# Patient Record
Sex: Female | Born: 2000 | Hispanic: Yes | Marital: Single | Smoking: Never smoker
Health system: Southern US, Community
[De-identification: ages and names within clinical notes are randomized; demographics above are authoritative.]

## PROBLEM LIST (undated history)

## (undated) DIAGNOSIS — M329 Systemic lupus erythematosus, unspecified: Secondary | ICD-10-CM

## (undated) DIAGNOSIS — E0590 Thyrotoxicosis, unspecified without thyrotoxic crisis or storm: Secondary | ICD-10-CM

## (undated) DIAGNOSIS — D6861 Antiphospholipid syndrome: Secondary | ICD-10-CM

## (undated) HISTORY — PX: CHOLECYSTECTOMY: SHX55

---
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
## 2022-07-11 IMAGING — CT CT ABD-PELV W/ CM
2 of 4 series · 16 of 46 positions shown, 18 images · IV contrast (APPLIED)
Comparison: None.

CLINICAL DATA: Abdominal pain.

EXAM:
CT ABDOMEN AND PELVIS WITH CONTRAST
TECHNIQUE: Multidetector CT imaging of the abdomen and pelvis was performed
using the standard protocol following bolus administration of
intravenous contrast.
CONTRAST:  100mL OMNIPAQUE IOHEXOL 300 MG/ML  SOLN

[Series 2: abd pel w · axial · 0.68mm/px · z∈[+641,+1056]mm · 13 of 91 slices shown, 15 images]
[im 4/91  soft-tissue]
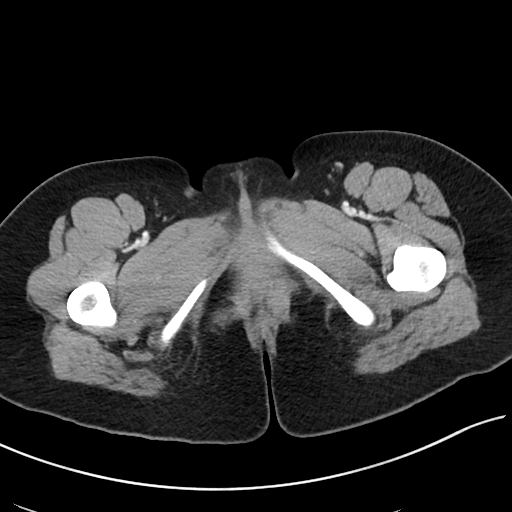
[im 4/91  bone]
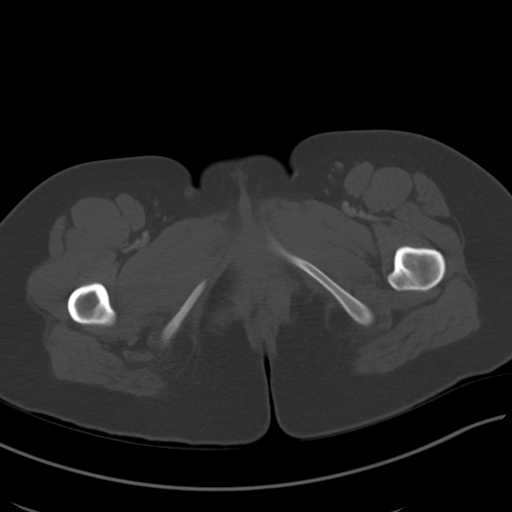
[im 11/91  soft-tissue]
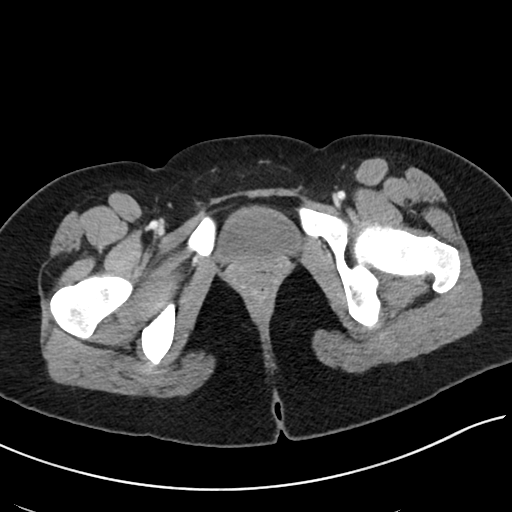
[im 19/91  soft-tissue]
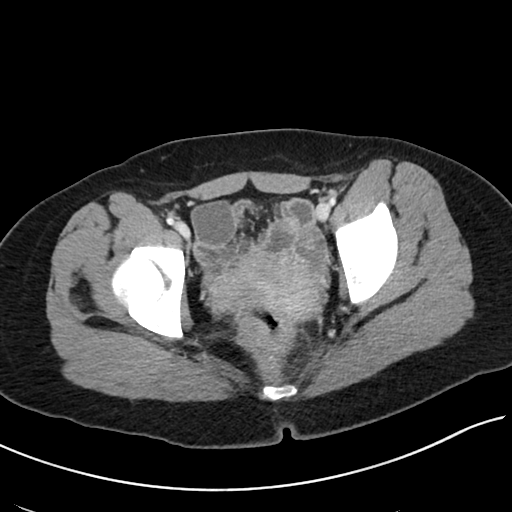
[im 26/91  soft-tissue]
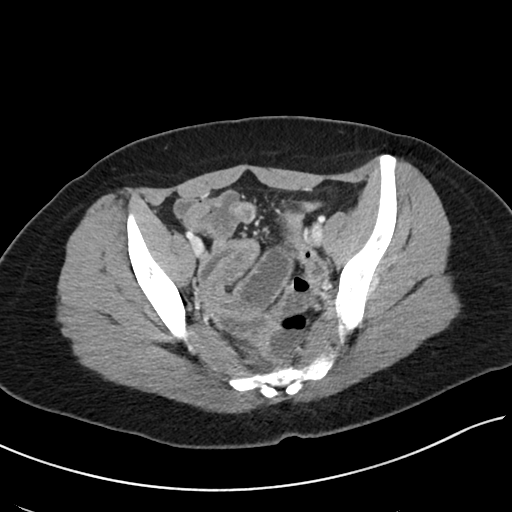
[im 33/91  soft-tissue]
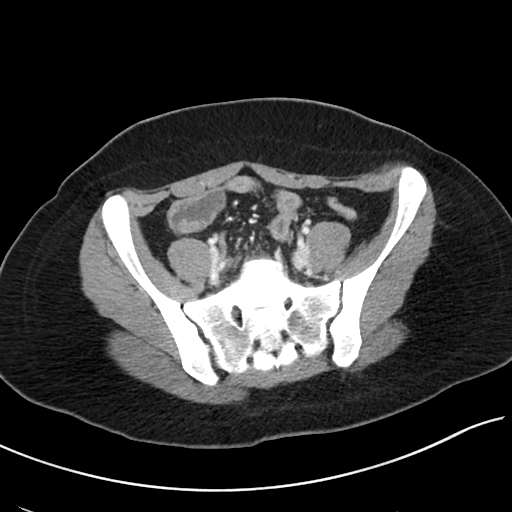
[im 40/91  soft-tissue]
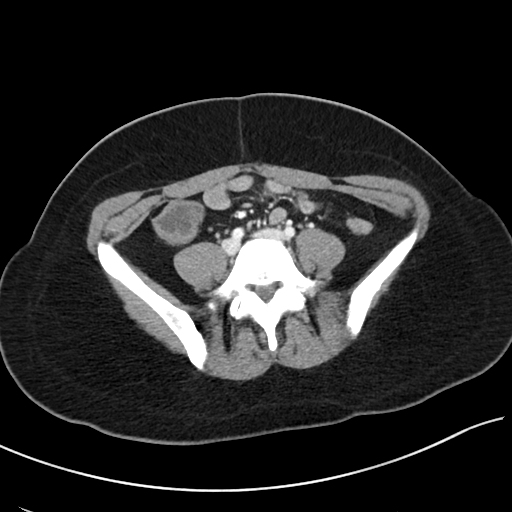
[im 47/91  soft-tissue]
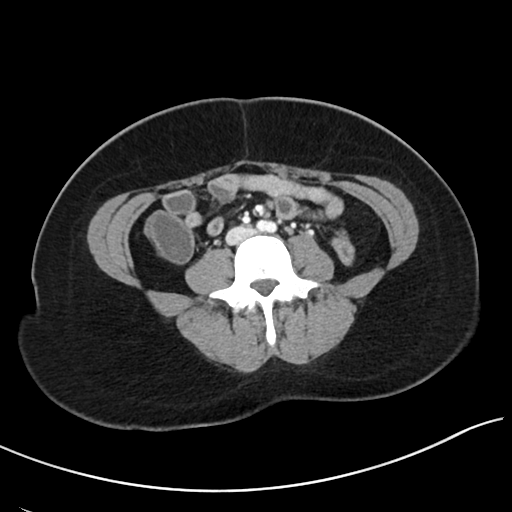
[im 51/91  soft-tissue]
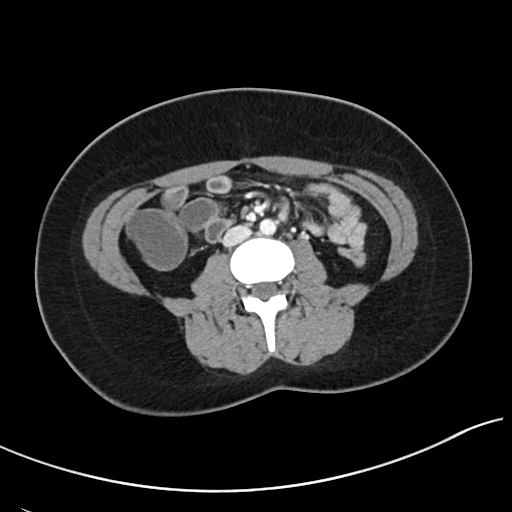
[im 58/91  soft-tissue]
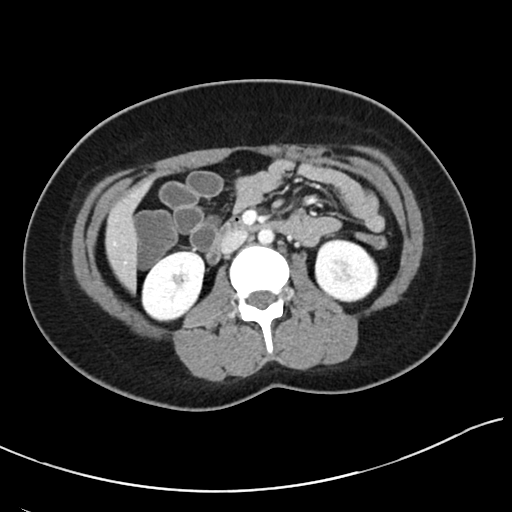
[im 58/91  bone]
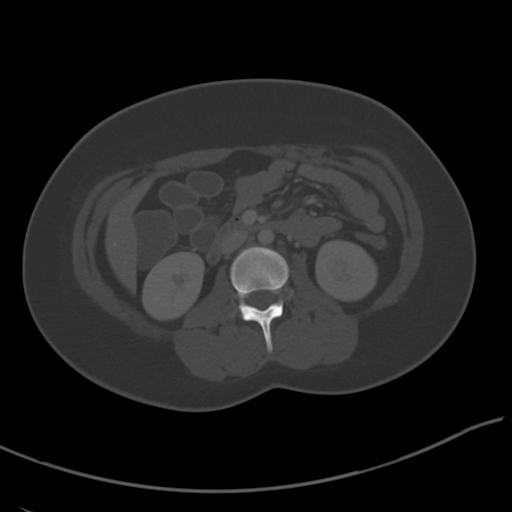
[im 65/91  soft-tissue]
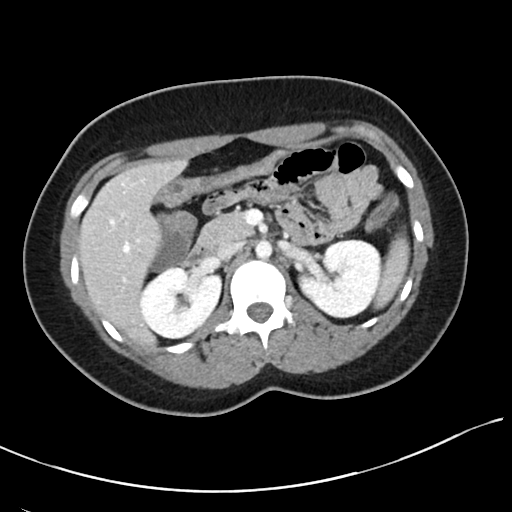
[im 73/91  soft-tissue]
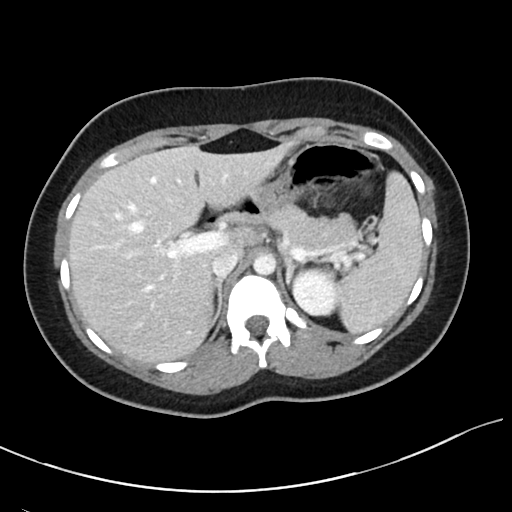
[im 80/91  soft-tissue]
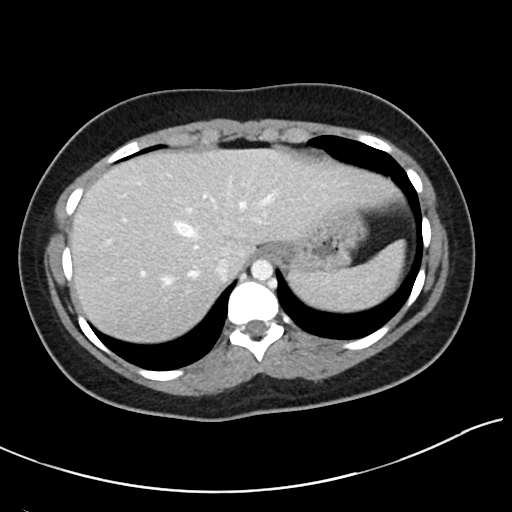
[im 87/91  soft-tissue]
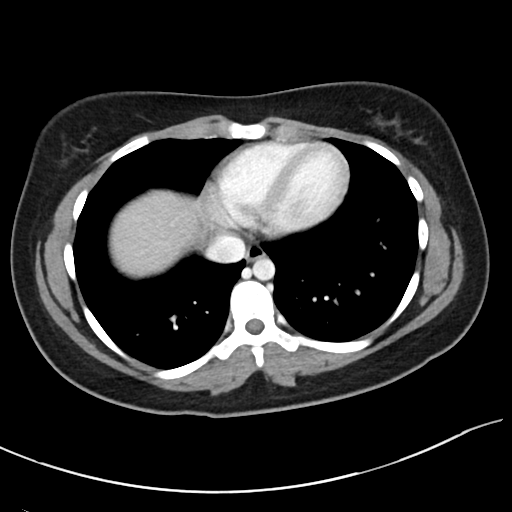

[Series 5: coronal · coronal · 0.63mm/px · 3 of 87 slices shown]
[im 29/87  soft-tissue]
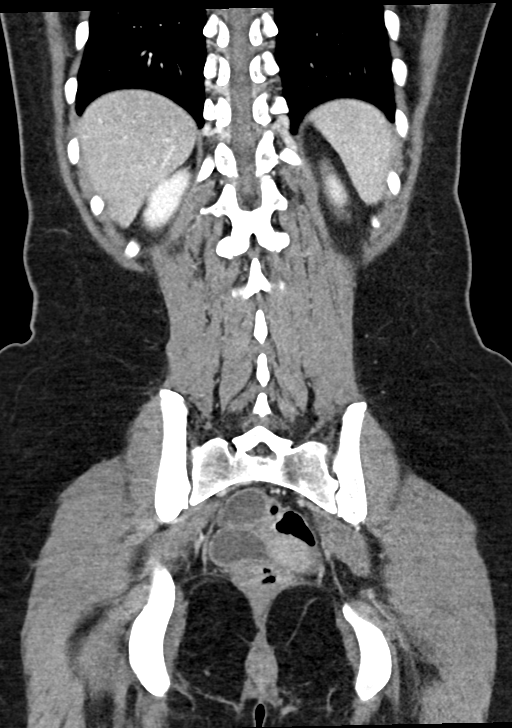
[im 39/87  soft-tissue]
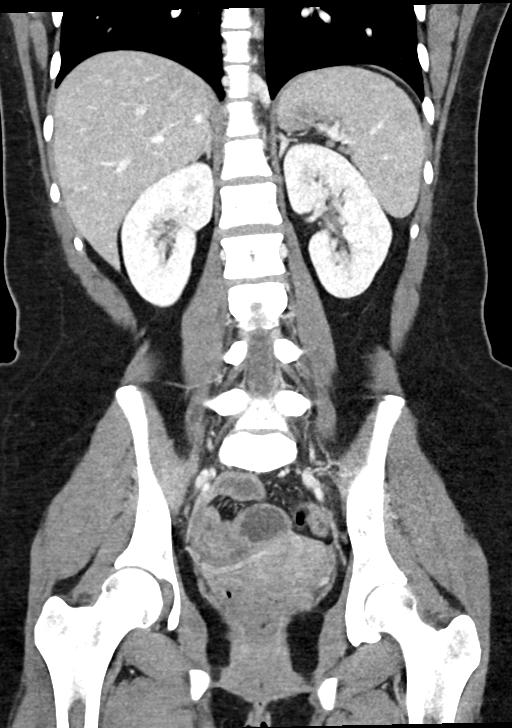
[im 48/87  soft-tissue]
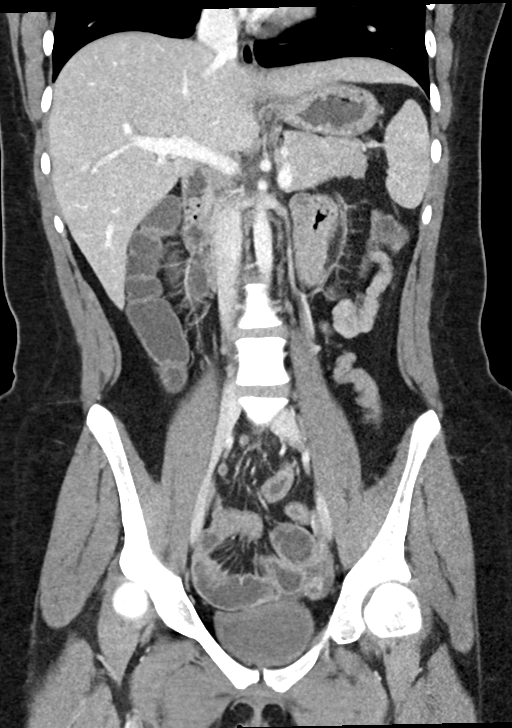

[16 of 46 positions shown; findings below may reference images not displayed]

FINDINGS: Lower chest: The visualized lung bases are clear.

No intra-abdominal free air or free fluid.

Hepatobiliary: The liver is unremarkable. No intrahepatic biliary
dilatation. Cholecystectomy.

Pancreas: Unremarkable. No pancreatic ductal dilatation or
surrounding inflammatory changes.

Spleen: Normal in size without focal abnormality.

Adrenals/Urinary Tract: The adrenal glands unremarkable. The
kidneys, visualized ureters, and urinary bladder appear
unremarkable.

Stomach/Bowel: Multiple fluid-filled loops of small bowel with
thickened walls most consistent with enteritis. There is loose stool
throughout the colon compatible with diarrheal state. There is no
bowel obstruction. The appendix is normal.

Vascular/Lymphatic: The abdominal aorta and IVC unremarkable. No
portal venous gas. There is no adenopathy.

Reproductive: The uterus is retroverted.  No adnexal masses.

Other: None

Musculoskeletal: No acute or significant osseous findings.
IMPRESSION: Enteritis with diarrheal state. No bowel obstruction. Normal
appendix.

## 2022-08-07 ENCOUNTER — Emergency Department (HOSPITAL_BASED_OUTPATIENT_CLINIC_OR_DEPARTMENT_OTHER): Payer: Medicaid Other

## 2022-08-07 ENCOUNTER — Encounter (HOSPITAL_BASED_OUTPATIENT_CLINIC_OR_DEPARTMENT_OTHER): Payer: Self-pay

## 2022-08-07 ENCOUNTER — Other Ambulatory Visit: Payer: Self-pay

## 2022-08-07 ENCOUNTER — Emergency Department (HOSPITAL_BASED_OUTPATIENT_CLINIC_OR_DEPARTMENT_OTHER)
Admission: EM | Admit: 2022-08-07 | Discharge: 2022-08-07 | Disposition: A | Discharge status: Home / Self Care | Payer: Medicaid Other | Attending: Emergency Medicine | Admitting: Emergency Medicine

## 2022-08-07 DIAGNOSIS — M79602 Pain in left arm: Secondary | ICD-10-CM | POA: Diagnosis not present

## 2022-08-07 DIAGNOSIS — Z79899 Other long term (current) drug therapy: Secondary | ICD-10-CM | POA: Diagnosis not present

## 2022-08-07 DIAGNOSIS — R2232 Localized swelling, mass and lump, left upper limb: Secondary | ICD-10-CM | POA: Diagnosis not present

## 2022-08-07 DIAGNOSIS — E039 Hypothyroidism, unspecified: Secondary | ICD-10-CM | POA: Diagnosis not present

## 2022-08-07 LAB — CBC
HCT: 42 % (ref 36.0–46.0)
Hemoglobin: 14.6 g/dL (ref 12.0–15.0)
MCH: 29.9 pg (ref 26.0–34.0)
MCHC: 34.8 g/dL (ref 30.0–36.0)
MCV: 85.9 fL (ref 80.0–100.0)
Platelets: 281 10*3/uL (ref 150–400)
RBC: 4.89 MIL/uL (ref 3.87–5.11)
RDW: 12.6 % (ref 11.5–15.5)
WBC: 6.6 10*3/uL (ref 4.0–10.5)
nRBC: 0 % (ref 0.0–0.2)

## 2022-08-07 LAB — BASIC METABOLIC PANEL
Anion gap: 8 (ref 5–15)
BUN: 8 mg/dL (ref 6–20)
CO2: 27 mmol/L (ref 22–32)
Calcium: 10.2 mg/dL (ref 8.9–10.3)
Chloride: 103 mmol/L (ref 98–111)
Creatinine, Ser: 0.59 mg/dL (ref 0.44–1.00)
GFR, Estimated: >60 mL/min (ref >60)
Glucose, Bld: 89 mg/dL (ref 70–99)
Potassium: 3.9 mmol/L (ref 3.5–5.1)
Sodium: 138 mmol/L (ref 135–145)

## 2022-08-07 NOTE — ED Provider Notes (Signed)
[facility] Provider Note   CSN: [id number] Arrival date & time: 08/07/22  1228     History  Chief Complaint  Patient presents with   Arm Swelling    Tonya Thomas is a 22 y.o. female.  HPI   22 year old female presents emergency department with complaints of lump on left upper extremity with swelling.  Patient states that she noticed area over the past 1 to 2 days.  Describes just noticing lump at the beginning with no pain.  Now she said it is mildly discomfort.  History of antiphospholipid syndrome and currently not anticoagulated.  Reports some subjective swelling of left upper extremity.  Denies any weakness or sensory deficits or known traumatic injury.  Pain is worsened with palpation but not movement of her upper extremity.  Denies chest pain, shortness of breath, fever, cough, congestion.  Past medical history significant for antiphospholipid syndrome, hypothyroidism  Home Medications Prior to Admission medications   Medication Sig Start Date End Date Taking? Authorizing Provider  levothyroxine (SYNTHROID) 25 MCG tablet Take 25 mcg by mouth daily. 07/18/21   [provider]  ondansetron (ZOFRAN) 4 MG tablet Take 1 tablet (4 mg total) by mouth every 8 (eight) hours as needed for nausea or vomiting. 06/18/21   Tegeler, Gwenyth Allegra, MD      Allergies    Patient has no known allergies.    Review of Systems   Review of Systems  All other systems reviewed and are negative.   Physical Exam Updated Vital Signs BP 122/77 (BP Location: Right Arm)   Pulse 74   Temp 97.8 F (36.6 C)   Resp 16   Ht 5' 1"$  (1.549 m)   Wt 64.4 kg   SpO2 100%   BMI 26.83 kg/m  Physical Exam Vitals and nursing note reviewed.  Constitutional:      General: She is not in acute distress.    Appearance: She is well-developed.  HENT:     Head: Normocephalic and atraumatic.  Eyes:     Conjunctiva/sclera: Conjunctivae normal.   Cardiovascular:     Rate and Rhythm: Normal rate and regular rhythm.     Heart sounds: No murmur heard. Pulmonary:     Effort: Pulmonary effort is normal. No respiratory distress.     Breath sounds: Normal breath sounds.  Abdominal:     Palpations: Abdomen is soft.     Tenderness: There is no abdominal tenderness.  Musculoskeletal:        General: No swelling.     Cervical back: Neck supple.     Comments: Very faint possible small palpable mass on left posterior upper arm and area of tricep; area possibly more pronounced than right side.  No obvious induration, erythema, palpable fluctuance.  Possible swelling but difficult to assess secondary to body habitus.  Radial pulses 2+ bilaterally.  Full AROM of bilateral shoulders, elbows, wrists, digits.  Muscle strength 5 out of 5 bilaterally.  Skin:    General: Skin is warm and dry.     Capillary Refill: Capillary refill takes less than 2 seconds.  Neurological:     Mental Status: She is alert.  Psychiatric:        Mood and Affect: Mood normal.     ED Results / Procedures / Treatments   Labs (all labs ordered are listed, but only abnormal results are displayed) Labs Reviewed  BASIC METABOLIC PANEL  CBC    EKG None  Radiology [place] LT UPPER  EXTREM LTD SOFT TISSUE NON VASCULAR  Result Date: 08/07/2022 CLINICAL DATA:  Swelling along the dorsal triceps. EXAM: ULTRASOUND LEFT UPPER EXTREMITY LIMITED TECHNIQUE: Ultrasound examination of the upper extremity soft tissues was performed in the area of clinical concern of the dorsal aspect of the upper from deltoid to the elbow. COMPARISON:  None Available. FINDINGS: Normal triceps muscle and tendon vessels are patent. No significant soft tissue edema. IMPRESSION: Normal ultrasound of the left triceps. Electronically Signed   By: Keane Police D.O.   On: 08/07/2022 15:52    Procedures Procedures    Medications Ordered in ED Medications - No data to display  ED Course/ Medical Decision  Making/ A&P                             Medical Decision Making Amount and/or Complexity of Data Reviewed Labs: ordered. Radiology: ordered.   This patient presents to the ED for concern of left arm pain, this involves an extensive number of treatment options, and is a complaint that carries with it a high risk of complications and morbidity.  The differential diagnosis includes fracture, strain chest pain, dislocation, mass, DVT, PAD, ischemic limb, compartment syndrome, neurovascular compromise, ligamentous/tendinous injury, muscular injury   Co morbidities that complicate the patient evaluation  See HPI   Additional history obtained:  Additional history obtained from EMR External records from outside source obtained and reviewed including hospital records   Lab Tests:  I Ordered, and personally interpreted labs.  The pertinent results include: No leukocytosis noted.  No evidence of anemia.  Platelets within range.  No electrolyte abnormalities appreciated.  No renal dysfunction.   Imaging Studies ordered:  I ordered imaging studies including left upper extremity ultrasound I independently visualized and interpreted imaging which showed no acute soft tissue mass or DVT I agree with the radiologist interpretation -Discussed case with ultrasound tech who initially performed vascular ultrasound of left upper extremity for DVT rule out when negative, switch to soft tissue of left upper extremity which was also negative.  Cardiac Monitoring: / EKG:  The patient was maintained on a cardiac monitor.  I personally viewed and interpreted the cardiac monitored which showed an underlying rhythm of: Sinus rhythm   Consultations Obtained:  N/a   Problem List / ED Course / Critical interventions / Medication management  Left upper extremity pain Reevaluation of the patient showed that the patient stayed the same I have reviewed the patients home medicines and have made  adjustments as needed   Social Determinants of Health:  Denies tobacco, licit drug use   Test / Admission - Considered:  Left upper extremity pain Vitals signs within normal range and stable throughout visit. Laboratory/imaging studies significant for: See above Patient's workup today overall reassuring.  No clinical indication for DVT of upper extremity or soft tissue mass appreciated on ultrasound.  Patient has equal pulses bilaterally.  Doubt DVT, PAD.  Could be secondary to muscular injury given area of tenderness and reproducibility with resisted extension of left upper extremity at elbow.  Patient recommended rest, ice, elevation of affected extremity as well as NSAIDs in the form of ibuprofen/Aleve.  Follow-up with primary care recommended for reassessment of symptoms.  Treatment plan discussed at length with patient and she acknowledged understanding was agreeable to said plan. Worrisome signs and symptoms were discussed with the patient, and the patient acknowledged understanding to return to the ED if noticed. Patient was stable upon  discharge.          Final Clinical Impression(s) / ED Diagnoses Final diagnoses:  Left arm pain    Rx / DC Orders ED Discharge Orders     None         Wilnette Kales, [place] 08/07/22 1601    Leanord Asal K, DO 08/07/22 1606

## 2022-08-07 NOTE — ED Triage Notes (Addendum)
Patient here POV from Home.  Endorses Area of localized swelling to left upper arm that was noted Last PM. Causes Discomfort to area and surrounding tissue. No Warmth Noted.   No Known Fevers. No Trauma.   NAD Noted during Triage. A&Ox4. GCS 15. Ambulatory

## 2022-08-07 NOTE — Discharge Instructions (Signed)
Note the workup today is overall reassuring.  No evidence of soft tissue mass or blood clot in your left upper extremity.  Notes likely secondary to muscular injury.  Treat with rest, ice, elevation and NSAIDs in the form of ibuprofen/Aleve.  Recommend follow-up with primary care for reassessment of symptoms.  Please do not hesitate to return to emergency department for worrisome signs and symptoms we discussed become apparent.

## 2022-11-24 ENCOUNTER — Emergency Department (HOSPITAL_BASED_OUTPATIENT_CLINIC_OR_DEPARTMENT_OTHER)
Admission: EM | Admit: 2022-11-24 | Discharge: 2022-11-24 | Disposition: A | Discharge status: Home / Self Care | Payer: Medicaid Other | Attending: Emergency Medicine | Admitting: Emergency Medicine

## 2022-11-24 ENCOUNTER — Emergency Department (HOSPITAL_BASED_OUTPATIENT_CLINIC_OR_DEPARTMENT_OTHER): Payer: Medicaid Other

## 2022-11-24 ENCOUNTER — Encounter (HOSPITAL_BASED_OUTPATIENT_CLINIC_OR_DEPARTMENT_OTHER): Payer: Self-pay

## 2022-11-24 ENCOUNTER — Other Ambulatory Visit: Payer: Self-pay

## 2022-11-24 DIAGNOSIS — X58XXXA Exposure to other specified factors, initial encounter: Secondary | ICD-10-CM | POA: Diagnosis not present

## 2022-11-24 DIAGNOSIS — S29012A Strain of muscle and tendon of back wall of thorax, initial encounter: Secondary | ICD-10-CM | POA: Insufficient documentation

## 2022-11-24 DIAGNOSIS — S39012A Strain of muscle, fascia and tendon of lower back, initial encounter: Secondary | ICD-10-CM

## 2022-11-24 DIAGNOSIS — M549 Dorsalgia, unspecified: Secondary | ICD-10-CM | POA: Diagnosis present

## 2022-11-24 HISTORY — DX: Systemic lupus erythematosus, unspecified: M32.9

## 2022-11-24 MED ORDER — KETOROLAC TROMETHAMINE 15 MG/ML IJ SOLN
15.0000 mg | Freq: Once | INTRAMUSCULAR | Status: AC
  Administered 2022-11-24: 15 mg via INTRAMUSCULAR
  Filled 2022-11-24: qty 1

## 2022-11-24 NOTE — ED Provider Notes (Signed)
[place] EMERGENCY DEPARTMENT AT [facility] Provider Note   CSN: [id number] Arrival date & time: 11/24/22  1835     History  Chief Complaint  Patient presents with   Chest Pain    Tonya Thomas is a 22 y.o. female.  22 yo F with a cc of right sided back and chest pain.  Going on since this morning, though maybe she slept on in wrong.  Hasn't gotten better so went to urgent care for eval.  Patient has hx of antiphospholipid syndrome and due to this sent her here for evaluation.   She denies hx of PE or DVT.  Denies hemoptysis unilateral lower extremity edema recent surgery immobilization or hospitalization.   Chest Pain      Home Medications Prior to Admission medications   Medication Sig Start Date End Date Taking? Authorizing Provider  levothyroxine (SYNTHROID) 25 MCG tablet Take 25 mcg by mouth daily. 07/18/21   [provider]  ondansetron (ZOFRAN) 4 MG tablet Take 1 tablet (4 mg total) by mouth every 8 (eight) hours as needed for nausea or vomiting. 06/18/21   Tegeler, Canary Brim, MD      Allergies    Patient has no known allergies.    Review of Systems   Review of Systems  Cardiovascular:  Positive for chest pain.    Physical Exam Updated Vital Signs BP 122/75 (BP Location: Right Arm)   Pulse 74   Temp 98.2 F (36.8 C) (Oral)   Resp 18   Ht 5\' 1"  (1.549 m)   Wt 65.8 kg   SpO2 98%   BMI 27.40 kg/m  Physical Exam Vitals and nursing note reviewed.  Constitutional:      General: She is not in acute distress.    Appearance: She is well-developed. She is not diaphoretic.  HENT:     Head: Normocephalic and atraumatic.  Eyes:     Pupils: Pupils are equal, round, and reactive to light.  Cardiovascular:     Rate and Rhythm: Normal rate and regular rhythm.     Heart sounds: No murmur heard.    No friction rub. No gallop.  Pulmonary:     Effort: Pulmonary effort is normal.     Breath sounds: No wheezing or rales.  Abdominal:      General: There is no distension.     Palpations: Abdomen is soft.     Tenderness: There is no abdominal tenderness.  Musculoskeletal:        General: No tenderness.     Cervical back: Normal range of motion and neck supple.     Comments: Patient has pain in between the scapula and T-spine reproduces her discomfort.  Skin:    General: Skin is warm and dry.  Neurological:     Mental Status: She is alert and oriented to person, place, and time.  Psychiatric:        Behavior: Behavior normal.     ED Results / Procedures / Treatments   Labs (all labs ordered are listed, but only abnormal results are displayed) Labs Reviewed - No data to display  EKG EKG Interpretation  Date/Time:  Sunday November 24 2022 18:43:41 EDT Ventricular Rate:  71 PR Interval:  141 QRS Duration: 88 QT Interval:  390 QTC Calculation: 424 R Axis:   80 Text Interpretation: Sinus rhythm No old tracing to compare Confirmed by Melene Plan [phone]) on 11/24/2022 6:45:27 PM  Radiology DG Chest Port 1 View  Result Date: 11/24/2022 CLINICAL DATA:  Chest pain. EXAM: PORTABLE CHEST 1 VIEW COMPARISON:  Chest CTA 12/25/2020 FINDINGS: Low lung volumes.The cardiomediastinal contours are normal. The lungs are clear. Pulmonary vasculature is normal. No consolidation, pleural effusion, or pneumothorax. No acute osseous abnormalities are seen. IMPRESSION: Hypoventilatory chest without acute findings. Electronically Signed   By: Narda Rutherford M.D.   On: 11/24/2022 19:44    Procedures Procedures    Medications Ordered in ED Medications  ketorolac (TORADOL) 15 MG/ML injection 15 mg (15 mg Intramuscular Given 11/24/22 1903)    ED Course/ Medical Decision Making/ A&P                             Medical Decision Making Amount and/or Complexity of Data Reviewed Radiology: ordered.  Risk Prescription drug management.   22 yo F with a chief complaints of chest pain that radiates to the back.  This been going on since this  morning.  It is worse with twisting bending palpation.  Arm movement.  She went to urgent care and they were concerned about her history of antiphospholipid syndrome and sent her here for evaluation.  On record review the patient has an interesting history, she had a early delivery and had a workup done for that which showed antiphospholipid syndrome she is never had any miscarriages or clots.  Since then she has had more blood test to try and evaluate the cause of that.  She recently had a positive ANA and was referred to rheumatology.  I am not sure if it has any significance in this case.  Most likely is musculoskeletal by history and physical.  EKG is nonischemic.  Chest x-ray to evaluate for pneumothorax or focal infiltrate.  Chest x-ray independently interpreted by me without focal infiltrate or pneumothorax.  Will discharge the patient home.  PCP follow-up.  7:50 PM:  I have discussed the diagnosis/risks/treatment options with the patient.  Evaluation and diagnostic testing in the emergency department does not suggest an emergent condition requiring admission or immediate intervention beyond what has been performed at this time.  They will follow up with PCP. We also discussed returning to the ED immediately if new or worsening sx occur. We discussed the sx which are most concerning (e.g., sudden worsening pain, fever, inability to tolerate by mouth) that necessitate immediate return. Medications administered to the patient during their visit and any new prescriptions provided to the patient are listed below.  Medications given during this visit Medications  ketorolac (TORADOL) 15 MG/ML injection 15 mg (15 mg Intramuscular Given 11/24/22 1903)     The patient appears reasonably screen and/or stabilized for discharge and I doubt any other medical condition or other [facility] requiring further screening, evaluation, or treatment in the ED at this time prior to discharge.          Final Clinical  Impression(s) / ED Diagnoses Final diagnoses:  Back strain, initial encounter    Rx / DC Orders ED Discharge Orders     None         Melene Plan, DO 11/24/22 1950

## 2022-11-24 NOTE — ED Triage Notes (Signed)
Patient here POV from Home.  Endorses Right Sided CP for a few weeks intermittently along with Right Sided Upper Back Pain. Constant since this AM.   Some SOB. Some Nausea. No Emesis or Fevers.   NAD Noted during Triage. A&Ox4. Gcs 15. Ambulatory.

## 2022-11-24 NOTE — ED Notes (Signed)
Port X-ray at bedside.  

## 2022-11-24 NOTE — ED Notes (Signed)
Reviewed AVS with patient, patient expressed understanding of directions, denies further questions at this time. 

## 2022-11-24 NOTE — Discharge Instructions (Signed)
Your chest x-ray and EKG were normal.  Please return for worsening symptoms if you start developing worsening difficulty breathing if you are coughing up blood or if you pass out.  Please follow-up with your family doctor in the office.  Take 4 over the counter ibuprofen tablets 3 times a day or 2 over-the-counter naproxen tablets twice a day for pain. Also take tylenol 1000mg (2 extra strength) four times a day.

## 2023-05-01 ENCOUNTER — Emergency Department (HOSPITAL_BASED_OUTPATIENT_CLINIC_OR_DEPARTMENT_OTHER): Payer: Medicaid Other

## 2023-05-01 ENCOUNTER — Emergency Department (HOSPITAL_BASED_OUTPATIENT_CLINIC_OR_DEPARTMENT_OTHER)
Admission: EM | Admit: 2023-05-01 | Discharge: 2023-05-01 | Disposition: A | Discharge status: Home / Self Care | Payer: Medicaid Other | Attending: Emergency Medicine | Admitting: Emergency Medicine

## 2023-05-01 ENCOUNTER — Other Ambulatory Visit: Payer: Self-pay

## 2023-05-01 ENCOUNTER — Encounter (HOSPITAL_BASED_OUTPATIENT_CLINIC_OR_DEPARTMENT_OTHER): Payer: Self-pay | Admitting: Emergency Medicine

## 2023-05-01 DIAGNOSIS — R791 Abnormal coagulation profile: Secondary | ICD-10-CM | POA: Insufficient documentation

## 2023-05-01 DIAGNOSIS — Z859 Personal history of malignant neoplasm, unspecified: Secondary | ICD-10-CM | POA: Insufficient documentation

## 2023-05-01 DIAGNOSIS — M3210 Systemic lupus erythematosus, organ or system involvement unspecified: Secondary | ICD-10-CM | POA: Insufficient documentation

## 2023-05-01 DIAGNOSIS — R1012 Left upper quadrant pain: Secondary | ICD-10-CM | POA: Insufficient documentation

## 2023-05-01 LAB — CBC
HCT: 39.6 % (ref 36.0–46.0)
Hemoglobin: 14 g/dL (ref 12.0–15.0)
MCH: 29.8 pg (ref 26.0–34.0)
MCHC: 35.4 g/dL (ref 30.0–36.0)
MCV: 84.3 fL (ref 80.0–100.0)
Platelets: 245 10*3/uL (ref 150–400)
RBC: 4.7 MIL/uL (ref 3.87–5.11)
RDW: 12.6 % (ref 11.5–15.5)
WBC: 5.7 10*3/uL (ref 4.0–10.5)
nRBC: 0 % (ref 0.0–0.2)

## 2023-05-01 LAB — COMPREHENSIVE METABOLIC PANEL
ALT: 33 U/L (ref 0–44)
AST: 28 U/L (ref 15–41)
Albumin: 4.4 g/dL (ref 3.5–5.0)
Alkaline Phosphatase: 75 U/L (ref 38–126)
Anion gap: 7 (ref 5–15)
BUN: 9 mg/dL (ref 6–20)
CO2: 24 mmol/L (ref 22–32)
Calcium: 9.8 mg/dL (ref 8.9–10.3)
Chloride: 105 mmol/L (ref 98–111)
Creatinine, Ser: 0.56 mg/dL (ref 0.44–1.00)
GFR, Estimated: >60 mL/min (ref >60)
Glucose, Bld: 99 mg/dL (ref 70–99)
Potassium: 3.9 mmol/L (ref 3.5–5.1)
Sodium: 136 mmol/L (ref 135–145)
Total Bilirubin: 0.5 mg/dL (ref <1.2)
Total Protein: 7.6 g/dL (ref 6.5–8.1)

## 2023-05-01 LAB — D-DIMER, QUANTITATIVE: D-Dimer, Quant: 1 ug{FEU}/mL — ABNORMAL HIGH (ref 0.00–0.50)

## 2023-05-01 LAB — URINALYSIS, ROUTINE W REFLEX MICROSCOPIC
Bilirubin Urine: NEGATIVE
Glucose, UA: NEGATIVE mg/dL
Hgb urine dipstick: NEGATIVE
Ketones, ur: NEGATIVE mg/dL
Leukocytes,Ua: NEGATIVE
Nitrite: NEGATIVE
Protein, ur: NEGATIVE mg/dL
Specific Gravity, Urine: 1.025 (ref 1.005–1.030)
pH: 7 (ref 5.0–8.0)

## 2023-05-01 LAB — PREGNANCY, URINE: Preg Test, Ur: NEGATIVE

## 2023-05-01 LAB — LIPASE, BLOOD: Lipase: 22 U/L (ref 11–51)

## 2023-05-01 MED ORDER — ALUM & MAG HYDROXIDE-SIMETH 200-200-20 MG/5ML PO SUSP
30.0000 mL | Freq: Once | ORAL | Status: AC
  Administered 2023-05-01: 30 mL via ORAL
  Filled 2023-05-01: qty 30

## 2023-05-01 MED ORDER — FAMOTIDINE IN NACL 20-0.9 MG/50ML-% IV SOLN
20.0000 mg | INTRAVENOUS | Status: AC
  Administered 2023-05-01: 20 mg via INTRAVENOUS
  Filled 2023-05-01: qty 50

## 2023-05-01 MED ORDER — IOHEXOL 350 MG/ML SOLN
100.0000 mL | Freq: Once | INTRAVENOUS | Status: AC | PRN
Start: 2023-05-01 — End: 2023-05-01
  Administered 2023-05-01: 75 mL via INTRAVENOUS

## 2023-05-01 NOTE — ED Triage Notes (Signed)
Pt c/o LUQ pain today with nausea

## 2023-05-01 NOTE — ED Notes (Signed)
Patient transported to CT 

## 2023-05-01 NOTE — ED Notes (Addendum)
RT Note:   Patient oxygen saturation on room air while at rest 97% HR 72, RR 16, Patient oxygen saturation on room air while ambulating 100%, HR 82, RR18  Patient tolerated the walk well with no distress

## 2023-05-01 NOTE — ED Provider Notes (Signed)
[place] EMERGENCY DEPARTMENT AT [facility] Provider Note   CSN: [id number] Arrival date & time: 05/01/23  1151     History  Chief Complaint  Patient presents with   Abdominal Pain    Tonya Thomas is a 22 y.o. female with PMHx antiphospholipid syndrome, hyperthyroidism, lupus who presents to ED concerned for intermittent LUQ pain and nausea x1 day. 7/10 severity. Walking makes it worse. No change in severity when eating. LMP 5 weeks ago. Denies sore throat and fatigue.  Patient most concerned for blood clot given her medical hx.  Denies fever, chest pain, dyspnea, cough, vomiting, diarrhea. Denies recent surgery/immobilization, hx DVT/PE, hemoptysis, hx cancer in the past 6 months, calf swelling/tenderness. Denies tob hx. No birth control.      Abdominal Pain      Home Medications Prior to Admission medications   Medication Sig Start Date End Date Taking? Authorizing Provider  levothyroxine (SYNTHROID) 25 MCG tablet Take 25 mcg by mouth daily. 07/18/21   [provider]  ondansetron (ZOFRAN) 4 MG tablet Take 1 tablet (4 mg total) by mouth every 8 (eight) hours as needed for nausea or vomiting. 06/18/21   Tegeler, Canary Brim, MD      Allergies    Patient has no known allergies.    Review of Systems   Review of Systems  Gastrointestinal:  Positive for abdominal pain.    Physical Exam Updated Vital Signs BP (!) 104/55   Pulse 87   Temp 99.1 F (37.3 C) (Temporal)   Resp 18   Wt 65.8 kg   LMP 03/29/2023   SpO2 99%   BMI 27.40 kg/m  Physical Exam Vitals and nursing note reviewed.  Constitutional:      General: She is not in acute distress. HENT:     Head: Normocephalic and atraumatic.     Mouth/Throat:     Mouth: Mucous membranes are moist.     Pharynx: No oropharyngeal exudate or posterior oropharyngeal erythema.  Eyes:     General: No scleral icterus.       Right eye: No discharge.        Left eye: No discharge.      Conjunctiva/sclera: Conjunctivae normal.  Cardiovascular:     Rate and Rhythm: Normal rate and regular rhythm.     Pulses: Normal pulses.     Heart sounds: Normal heart sounds. No murmur heard. Pulmonary:     Effort: Pulmonary effort is normal. No respiratory distress.     Breath sounds: Normal breath sounds. No wheezing, rhonchi or rales.  Abdominal:     General: Abdomen is flat. Bowel sounds are normal. There is no distension.     Palpations: Abdomen is soft. There is no splenomegaly or mass.     Tenderness: There is abdominal tenderness in the left upper quadrant.  Musculoskeletal:     Right lower leg: No edema.     Left lower leg: No edema.     Comments: No calf tenderness to palpation. No calf swelling or erythema.  Skin:    General: Skin is warm and dry.     Findings: No rash.  Neurological:     General: No focal deficit present.     Mental Status: She is alert. Mental status is at baseline.  Psychiatric:        Mood and Affect: Mood normal.        Behavior: Behavior normal.     ED Results / Procedures / Treatments   Labs (all  labs ordered are listed, but only abnormal results are displayed) Labs Reviewed  D-DIMER, QUANTITATIVE - Abnormal; Notable for the following components:      Result Value   D-Dimer, Quant 1.00 (*)    All other components within normal limits  LIPASE, BLOOD  COMPREHENSIVE METABOLIC PANEL  CBC  URINALYSIS, ROUTINE W REFLEX MICROSCOPIC  PREGNANCY, URINE    EKG EKG Interpretation Date/Time:  Thursday May 01 2023 15:27:12 EST Ventricular Rate:  79 PR Interval:  144 QRS Duration:  93 QT Interval:  386 QTC Calculation: 443 R Axis:   69  Text Interpretation: Sinus rhythm Borderline T abnormalities, anterior leads Confirmed by Coralee Pesa [phone]) on 05/01/2023 3:51:39 PM  Radiology CT Angio Chest PE W/Cm &/Or Wo Cm  Result Date: 05/01/2023 CLINICAL DATA:  Pulmonary embolism (PE) suspected, low to intermediate prob, positive D-dimer.  Left upper quadrant pain EXAM: CT ANGIOGRAPHY CHEST WITH CONTRAST TECHNIQUE: Multidetector CT imaging of the chest was performed using the standard protocol during bolus administration of intravenous contrast. Multiplanar CT image reconstructions and MIPs were obtained to evaluate the vascular anatomy. RADIATION DOSE REDUCTION: This exam was performed according to the departmental dose-optimization program which includes automated exposure control, adjustment of the mA and/or kV according to patient size and/or use of iterative reconstruction technique. CONTRAST:  75mL OMNIPAQUE IOHEXOL 350 MG/ML SOLN COMPARISON:  None Available. FINDINGS: Cardiovascular: No filling defects in the pulmonary arteries to suggest pulmonary emboli. Heart is normal size. Aorta is normal caliber. Mediastinum/Nodes: No mediastinal, hilar, or axillary adenopathy. Trachea and esophagus are unremarkable. Thyroid unremarkable Lungs/Pleura: Lungs are clear. No focal airspace opacities or suspicious nodules. No effusions. Upper Abdomen: No acute findings Musculoskeletal: Chest wall soft tissues are unremarkable. No acute bony abnormality. Review of the MIP images confirms the above findings. IMPRESSION: No evidence of pulmonary embolus No acute cardiopulmonary disease. Electronically Signed   By: Charlett Nose M.D.   On: 05/01/2023 20:17    Procedures Procedures    Medications Ordered in ED Medications  famotidine (PEPCID) IVPB 20 mg premix (0 mg Intravenous Stopped 05/01/23 1556)  alum & mag hydroxide-simeth (MAALOX/MYLANTA) 200-200-20 MG/5ML suspension 30 mL (30 mLs Oral Given 05/01/23 1519)  iohexol (OMNIPAQUE) 350 MG/ML injection 100 mL (75 mLs Intravenous Contrast Given 05/01/23 1831)    ED Course/ Medical Decision Making/ A&P                                 Medical Decision Making Amount and/or Complexity of Data Reviewed Labs: ordered. Radiology: ordered.  Risk OTC drugs. Prescription drug management.    This  patient presents to the ED for concern of abdominal pain, this involves an extensive number of treatment options, and is a complaint that carries with it a high risk of complications and morbidity.  The differential diagnosis includes gastroenteritis, colitis, small bowel obstruction, appendicitis, cholecystitis, pancreatitis, nephrolithiasis, UTI, pyleonephritis, ruptured ectopic pregnancy, PID, ovarian torsion.   Co morbidities that complicate the patient evaluation  antiphospholipid syndrome, hyperthyroidism, lupus   Additional history obtained:  Dr. Caryn Section PCP   Lab Tests:  I Ordered, and personally interpreted labs.  The pertinent results include: CBC with differential: No concern for anemia or leukocytosis CMP: no concern for electrolyte abnormality; no concern for kidney/liver damage Lipase: within normal limits UA: not concerning for infection Urine Pregnancy: negative D-dimer: elevated at 1.00   Cardiac Monitoring: / EKG:  The patient was maintained on a cardiac  monitor.  I personally viewed and interpreted the cardiac monitored which showed an underlying rhythm of: Sinus rhythm without acute ST changes or arrhythmias   Problem List / ED Course / Critical interventions / Medication management  Patient presented for LUQ pain x1 day. On exam patient with tenderness to palpation on LUQ.  Patient ambulatory in ED with 100% O2 on RA and HR in the 80s.  Denying any other infectious symptoms today.  Patient stating that she is mostly concerned for blood clot given her medical history of antiphospholipid syndrome. CMP reassuring.  CBC without leukocytosis or anemia.  UA without concern for infection.  UPT negative.  Lipase within normal limits.  EKG reassuring. Shared decision making with patient who agreed to initially start with a GI cocktail.  If this did not help resolve her symptoms we will then obtain a D-dimer.  Patient was educated that if the D-dimer is positive we would have  to proceed with CTA chest to rule out PE. GI cocktail did not help relieve her symptoms.  D-dimer was elevated at 1.00.  Proceeded with CTA chest to further assess for PE which was negative.  Shared results with patient and recommend following up with PCP.  Patient verbalized understanding of plan and stated that she was ready to go home. I have reviewed the patients home medicines and have made adjustments as needed Patient was given return precautions. Patient stable for discharge at this time.  Patient verbalized understanding of plan. Discharged in good condition  Ddx: These are considered less likely due to history of present illness and physical exam. -gastroenteritis: No vomiting in ED; no fever; tolerating PO intake -colitis: Denies diarrhea, patient afebrile  -appendicitis: Negative McBurney point, rebound tenderness, psoas, obturator sign  -cholecystitis: Negative Murphy sign; liver enzymes within normal limits  -pancreatitis: No LUQ tenderness to palpation, lipase within normal limits  -nephrolithiasis: Denies flank pain and urinary complaints  -UTI/pyelonephritis: Denies urinary complaints  -ruptured ectopic pregnancy, PID, ovarian torsion: pain does not radiate into pelvis -ACS/Stemi: EKG reassuring -PE: CTA without concern   Social Determinants of Health:  none           Final Clinical Impression(s) / ED Diagnoses Final diagnoses:  LUQ pain    Rx / DC Orders ED Discharge Orders     None         Dorthy Cooler, [place] 05/01/23 2032    Rozelle Logan, DO 05/02/23 0026

## 2023-05-01 NOTE — Discharge Instructions (Signed)
It was a pleasure caring for you today.  Workup is reassuring today.  Please follow-up with your primary care provider.  Seek emergency care experiencing any new or worsening symptoms.
